# Patient Record
Sex: Female | Born: 1970 | Race: Black or African American | Hispanic: No | Marital: Single
Health system: Southern US, Community
[De-identification: ages and names within clinical notes are randomized; demographics above are authoritative.]

---

## 2010-12-22 ENCOUNTER — Other Ambulatory Visit: Payer: Self-pay | Admitting: Neurology

## 2010-12-22 DIAGNOSIS — H538 Other visual disturbances: Secondary | ICD-10-CM

## 2010-12-29 ENCOUNTER — Ambulatory Visit (HOSPITAL_COMMUNITY)
Admission: RE | Admit: 2010-12-29 | Discharge: 2010-12-29 | Disposition: A | Discharge status: Home / Self Care | Payer: Medicaid Other | Source: Ambulatory Visit | Attending: Neurology | Admitting: Neurology

## 2010-12-29 ENCOUNTER — Inpatient Hospital Stay (HOSPITAL_COMMUNITY): Admission: RE | Admit: 2010-12-29 | Discharge: 2010-12-29 | Payer: Self-pay | Source: Ambulatory Visit

## 2010-12-29 ENCOUNTER — Other Ambulatory Visit: Payer: Self-pay | Admitting: Neurology

## 2010-12-29 DIAGNOSIS — H538 Other visual disturbances: Secondary | ICD-10-CM | POA: Insufficient documentation

## 2010-12-29 DIAGNOSIS — R51 Headache: Secondary | ICD-10-CM | POA: Insufficient documentation

## 2010-12-29 DIAGNOSIS — R209 Unspecified disturbances of skin sensation: Secondary | ICD-10-CM | POA: Insufficient documentation

## 2010-12-29 LAB — CSF CELL COUNT WITH DIFFERENTIAL: Tube #: 4

## 2010-12-29 LAB — PROTEIN AND GLUCOSE, CSF: Glucose, CSF: 65 mg/dL (ref 43–76)

## 2010-12-29 NOTE — Progress Notes (Signed)
Pt c/o headache. Acetaminophen 650 mg po given per standing order (unable to pull med up from order list).

## 2010-12-29 NOTE — Progress Notes (Signed)
Arrived to Short Stay via stretcher lying on back. Bandaide dry, intact. Towel folded and placed to site for pressure. Family with pt.

## 2010-12-29 NOTE — Progress Notes (Signed)
Lying on side to eat lunch. Bandaide dry.

## 2010-12-29 NOTE — Procedures (Signed)
Lumbar Puncture Procedure Note  Pre-operative Diagnosis: headache, extremity numbness  Post-operative Diagnosis: headache, extremity numbness  Indications: Diagnostic  Procedure Details   Consent: Informed consent was obtained. Risks of the procedure were discussed including: infection, bleeding, pain and headache.  The patient was positioned under sterile conditions. Betadine solution and sterile drapes were utilized. A spinal needle was inserted at the L3-4 interspace.  Spinal fluid was obtained and sent to the laboratory.  Findings 10 mL of clear spinal fluid was obtained. Opening Pressure: 35 cm H2O pressure.  Complications:  None; patient tolerated the procedure well.        Condition: stable  Plan Bed rest for 4 hours. Tylenol 650 mg for pain. Call office if you develop a severe headache, nausea, vomiting, or fever greater than 100.5 F.

## 2010-12-29 NOTE — Progress Notes (Signed)
Instructions given written and oral to pt and family. States headache has returned but refuses more pain med.

## 2010-12-29 NOTE — Progress Notes (Signed)
Sitting up slowly. Ambulated to BR to void. States she feels fine. Bandaide dry. Discharged to car, ambulatory in good condition.

## 2010-12-29 NOTE — Progress Notes (Signed)
States headache much improved.

## 2010-12-30 ENCOUNTER — Other Ambulatory Visit (HOSPITAL_COMMUNITY): Payer: Self-pay

## 2010-12-30 LAB — CRYPTOCOCCAL ANTIGEN, CSF: Crypto Ag: NEGATIVE

## 2012-06-29 DIAGNOSIS — R413 Other amnesia: Secondary | ICD-10-CM | POA: Diagnosis not present

## 2012-06-29 DIAGNOSIS — R209 Unspecified disturbances of skin sensation: Secondary | ICD-10-CM | POA: Diagnosis not present

## 2012-06-29 DIAGNOSIS — G932 Benign intracranial hypertension: Secondary | ICD-10-CM | POA: Diagnosis not present

## 2012-06-29 DIAGNOSIS — J309 Allergic rhinitis, unspecified: Secondary | ICD-10-CM | POA: Diagnosis not present

## 2012-07-01 ENCOUNTER — Other Ambulatory Visit: Payer: Self-pay | Admitting: Neurology

## 2012-07-01 DIAGNOSIS — R209 Unspecified disturbances of skin sensation: Secondary | ICD-10-CM

## 2012-07-01 DIAGNOSIS — R51 Headache: Secondary | ICD-10-CM

## 2012-07-15 ENCOUNTER — Ambulatory Visit (HOSPITAL_COMMUNITY): Admission: RE | Admit: 2012-07-15 | Payer: Medicaid Other | Source: Ambulatory Visit

## 2012-07-20 ENCOUNTER — Ambulatory Visit (HOSPITAL_COMMUNITY)
Admission: RE | Admit: 2012-07-20 | Discharge: 2012-07-20 | Disposition: A | Discharge status: Home / Self Care | Payer: Medicaid Other | Source: Ambulatory Visit | Attending: Neurology | Admitting: Neurology

## 2012-07-20 ENCOUNTER — Encounter (HOSPITAL_COMMUNITY): Payer: Self-pay

## 2012-07-20 DIAGNOSIS — R51 Headache: Secondary | ICD-10-CM | POA: Diagnosis not present

## 2012-07-20 DIAGNOSIS — R209 Unspecified disturbances of skin sensation: Secondary | ICD-10-CM

## 2012-07-20 DIAGNOSIS — G935 Compression of brain: Secondary | ICD-10-CM | POA: Insufficient documentation

## 2012-08-12 DIAGNOSIS — J019 Acute sinusitis, unspecified: Secondary | ICD-10-CM | POA: Diagnosis not present

## 2012-09-14 DIAGNOSIS — G932 Benign intracranial hypertension: Secondary | ICD-10-CM | POA: Diagnosis not present

## 2012-09-14 DIAGNOSIS — R209 Unspecified disturbances of skin sensation: Secondary | ICD-10-CM | POA: Diagnosis not present

## 2012-09-14 DIAGNOSIS — R51 Headache: Secondary | ICD-10-CM | POA: Diagnosis not present

## 2013-02-15 DIAGNOSIS — R51 Headache: Secondary | ICD-10-CM | POA: Diagnosis not present

## 2013-02-15 DIAGNOSIS — Z79899 Other long term (current) drug therapy: Secondary | ICD-10-CM | POA: Diagnosis not present

## 2013-02-15 DIAGNOSIS — G932 Benign intracranial hypertension: Secondary | ICD-10-CM | POA: Diagnosis not present

## 2013-02-15 DIAGNOSIS — R209 Unspecified disturbances of skin sensation: Secondary | ICD-10-CM | POA: Diagnosis not present

## 2013-02-23 DIAGNOSIS — G609 Hereditary and idiopathic neuropathy, unspecified: Secondary | ICD-10-CM | POA: Diagnosis not present

## 2013-02-23 DIAGNOSIS — G4700 Insomnia, unspecified: Secondary | ICD-10-CM | POA: Diagnosis not present

## 2013-02-23 DIAGNOSIS — M5412 Radiculopathy, cervical region: Secondary | ICD-10-CM | POA: Diagnosis not present

## 2013-05-29 DIAGNOSIS — R197 Diarrhea, unspecified: Secondary | ICD-10-CM | POA: Diagnosis not present

## 2013-05-29 DIAGNOSIS — R112 Nausea with vomiting, unspecified: Secondary | ICD-10-CM | POA: Diagnosis not present

## 2013-05-29 DIAGNOSIS — R109 Unspecified abdominal pain: Secondary | ICD-10-CM | POA: Diagnosis not present

## 2013-05-30 DIAGNOSIS — G4700 Insomnia, unspecified: Secondary | ICD-10-CM | POA: Diagnosis not present

## 2013-05-30 DIAGNOSIS — Z79899 Other long term (current) drug therapy: Secondary | ICD-10-CM | POA: Diagnosis not present

## 2013-05-30 DIAGNOSIS — G43909 Migraine, unspecified, not intractable, without status migrainosus: Secondary | ICD-10-CM | POA: Diagnosis not present

## 2013-05-30 DIAGNOSIS — G894 Chronic pain syndrome: Secondary | ICD-10-CM | POA: Diagnosis not present

## 2013-08-08 DIAGNOSIS — G609 Hereditary and idiopathic neuropathy, unspecified: Secondary | ICD-10-CM | POA: Diagnosis not present

## 2013-08-08 DIAGNOSIS — M25519 Pain in unspecified shoulder: Secondary | ICD-10-CM | POA: Diagnosis not present

## 2013-08-08 DIAGNOSIS — R51 Headache: Secondary | ICD-10-CM | POA: Diagnosis not present

## 2013-08-08 DIAGNOSIS — Z79899 Other long term (current) drug therapy: Secondary | ICD-10-CM | POA: Diagnosis not present

## 2013-11-22 ENCOUNTER — Other Ambulatory Visit: Payer: Self-pay | Admitting: Neurology

## 2013-11-22 DIAGNOSIS — G932 Benign intracranial hypertension: Secondary | ICD-10-CM | POA: Diagnosis not present

## 2013-11-22 DIAGNOSIS — M25519 Pain in unspecified shoulder: Secondary | ICD-10-CM | POA: Diagnosis not present

## 2013-11-22 DIAGNOSIS — G609 Hereditary and idiopathic neuropathy, unspecified: Secondary | ICD-10-CM | POA: Diagnosis not present

## 2013-11-22 DIAGNOSIS — R51 Headache: Secondary | ICD-10-CM | POA: Diagnosis not present

## 2013-11-24 ENCOUNTER — Inpatient Hospital Stay (HOSPITAL_COMMUNITY): Admission: RE | Admit: 2013-11-24 | Payer: Medicaid Other | Source: Ambulatory Visit

## 2013-12-08 ENCOUNTER — Other Ambulatory Visit (HOSPITAL_COMMUNITY): Payer: Medicaid Other

## 2013-12-08 ENCOUNTER — Inpatient Hospital Stay (HOSPITAL_COMMUNITY): Admission: RE | Admit: 2013-12-08 | Payer: Medicaid Other | Source: Ambulatory Visit

## 2013-12-08 ENCOUNTER — Ambulatory Visit (HOSPITAL_COMMUNITY)
Admission: RE | Admit: 2013-12-08 | Discharge: 2013-12-08 | Disposition: A | Discharge status: Home / Self Care | Payer: Medicare Other | Source: Ambulatory Visit | Attending: Neurology | Admitting: Neurology

## 2013-12-08 VITALS — BP 132/83 | HR 86 | Temp 97.8°F | Resp 16

## 2013-12-08 DIAGNOSIS — G932 Benign intracranial hypertension: Secondary | ICD-10-CM

## 2013-12-08 LAB — CSF CELL COUNT WITH DIFFERENTIAL
RBC Count, CSF: 4 /mm3 — ABNORMAL HIGH
TUBE #: 4
WBC, CSF: 2 /mm3 (ref 0–5)

## 2013-12-08 LAB — PROTEIN, CSF: Total  Protein, CSF: 35 mg/dL (ref 15–45)

## 2013-12-08 LAB — GLUCOSE, CSF: Glucose, CSF: 66 mg/dL (ref 43–76)

## 2013-12-08 MED ORDER — ACETAMINOPHEN 325 MG PO TABS
650.0000 mg | ORAL_TABLET | ORAL | Status: DC | PRN
  Filled 2013-12-08: qty 2

## 2013-12-08 NOTE — Procedures (Signed)
Preprocedure Dx: Pseudotumor cerebri Postprocedure Dx: Pseudotumor cerebri Procedure:  Fluoroscopically guided lumbar puncture Radiologist:  Tyron RussellBoles Anesthesia:  2 ml of 1% lidocaine Specimen:  15 ml CSF, clear colorless EBL:   < 1 ml Opening pressure: 26 cm H2O (LLD) Closing pressure: 14 cm H2O (LLD) Complications: None - tolerated very well by patient

## 2013-12-08 NOTE — Discharge Instructions (Signed)
Lumbar Puncture, Care After °Refer to this sheet in the next few weeks. These instructions provide you with information on caring for yourself after your procedure. Your health care provider may also give you more specific instructions. Your treatment has been planned according to current medical practices, but problems sometimes occur. Call your health care provider if you have any problems or questions after your procedure. °WHAT TO EXPECT AFTER THE PROCEDURE °After your procedure, it is typical to have the following sensations: °· Mild discomfort or pain at the insertion site. °· Mild headache that is relieved with pain medicines. °HOME CARE INSTRUCTIONS °· Avoid lifting anything heavier than 10 lb (4.5 kg) for at least 12 hours after the procedure. °· Drink enough fluids to keep your urine clear or pale yellow. °SEEK MEDICAL CARE IF: °· You have fever or chills. °· You have nausea or vomiting. °· You have a headache that lasts for more than 2 days. °SEEK IMMEDIATE MEDICAL CARE IF: °· You have any numbness or tingling in your legs. °· You are unable to control your bowel or bladder. °· You have bleeding or swelling in your back at the insertion site. °· You are dizzy or faint. °Document Released: 02/28/2013 Document Reviewed: 02/28/2013 °[employer]® Patient Information ©2015 [employer]. This information is not intended to replace advice given to you by your health care provider. Make sure you discuss any questions you have with your health care provider. ° °

## 2014-02-20 DIAGNOSIS — M792 Neuralgia and neuritis, unspecified: Secondary | ICD-10-CM | POA: Diagnosis not present

## 2014-02-20 DIAGNOSIS — M25519 Pain in unspecified shoulder: Secondary | ICD-10-CM | POA: Diagnosis not present

## 2014-02-20 DIAGNOSIS — G932 Benign intracranial hypertension: Secondary | ICD-10-CM | POA: Diagnosis not present

## 2014-02-20 DIAGNOSIS — G4459 Other complicated headache syndrome: Secondary | ICD-10-CM | POA: Diagnosis not present

## 2014-10-31 DIAGNOSIS — M722 Plantar fascial fibromatosis: Secondary | ICD-10-CM | POA: Diagnosis not present

## 2015-02-22 DIAGNOSIS — R197 Diarrhea, unspecified: Secondary | ICD-10-CM | POA: Diagnosis not present

## 2015-02-22 DIAGNOSIS — R112 Nausea with vomiting, unspecified: Secondary | ICD-10-CM | POA: Diagnosis not present

## 2015-02-22 DIAGNOSIS — E876 Hypokalemia: Secondary | ICD-10-CM | POA: Diagnosis not present

## 2015-12-18 DIAGNOSIS — M7711 Lateral epicondylitis, right elbow: Secondary | ICD-10-CM | POA: Diagnosis not present

## 2016-03-03 DIAGNOSIS — J189 Pneumonia, unspecified organism: Secondary | ICD-10-CM | POA: Diagnosis not present

## 2016-03-03 DIAGNOSIS — R05 Cough: Secondary | ICD-10-CM | POA: Diagnosis not present

## 2016-04-02 DIAGNOSIS — R05 Cough: Secondary | ICD-10-CM | POA: Diagnosis not present

## 2016-04-02 DIAGNOSIS — J45909 Unspecified asthma, uncomplicated: Secondary | ICD-10-CM | POA: Diagnosis not present

## 2016-06-19 IMAGING — RF DG FLUORO GUIDE LUMBAR PUNCTURE
1 series · 1 of 1 positions shown · non-contrast
Comparison: none

CLINICAL DATA: Pseudotumor cerebri

[Series 1: run · 1 of 1 slices shown]
[im 1/1]
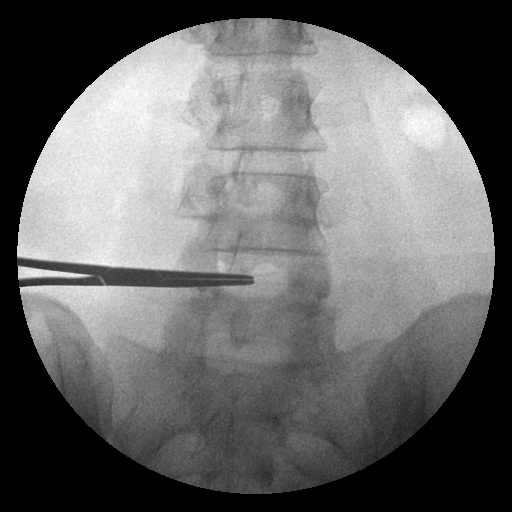

[1 of 1 positions shown; findings below may reference images not displayed]

EXAM:
DIAGNOSTIC AND THERAPEUTIC LUMBAR PUNCTURE UNDER FLUOROSCOPIC
GUIDANCE

FLUOROSCOPY TIME:  0 min 36 seconds

PROCEDURE:
Procedure, benefits, and risks were discussed with the patient,
including alternatives.

Patient's questions were answered.

Written informed consent was obtained.

Timeout protocol followed.

Patient placed prone.

L4-L5 disc space was localized under fluoroscopy.

Skin prepped and draped in usual sterile fashion.

Skin and soft tissues anesthetized with 2 mL of 1% lidocaine.

22 gauge spinal needle was advanced into the spinal canal where
clear colorless CSF was encountered with an opening pressure of 26
cm H2O (LLD).

15 mL of CSF was removed.

Fluid sent to laboratory in 4 tubes for requested analysis.

Closing pressure 14 cm H2O (LLD).

Procedure tolerated very well by patient without immediate
complication.
IMPRESSION: Diagnostic and therapeutic lumbar puncture as above.

## 2016-10-15 DIAGNOSIS — S20211A Contusion of right front wall of thorax, initial encounter: Secondary | ICD-10-CM | POA: Diagnosis not present

## 2016-10-15 DIAGNOSIS — E010 Iodine-deficiency related diffuse (endemic) goiter: Secondary | ICD-10-CM | POA: Diagnosis not present

## 2016-10-15 DIAGNOSIS — S20212A Contusion of left front wall of thorax, initial encounter: Secondary | ICD-10-CM | POA: Diagnosis not present

## 2016-10-15 DIAGNOSIS — Y9389 Activity, other specified: Secondary | ICD-10-CM | POA: Diagnosis not present

## 2016-10-15 DIAGNOSIS — S20219A Contusion of unspecified front wall of thorax, initial encounter: Secondary | ICD-10-CM | POA: Diagnosis not present

## 2016-10-15 DIAGNOSIS — Y92410 Unspecified street and highway as the place of occurrence of the external cause: Secondary | ICD-10-CM | POA: Diagnosis not present

## 2016-10-15 DIAGNOSIS — S279XXA Injury of unspecified intrathoracic organ, initial encounter: Secondary | ICD-10-CM | POA: Diagnosis not present

## 2016-10-15 DIAGNOSIS — S299XXA Unspecified injury of thorax, initial encounter: Secondary | ICD-10-CM | POA: Diagnosis not present

## 2016-10-20 DIAGNOSIS — S20219A Contusion of unspecified front wall of thorax, initial encounter: Secondary | ICD-10-CM | POA: Diagnosis not present
